# Patient Record
Sex: Male | Born: 1980 | Race: White | Hispanic: No | Marital: Married | Smoking: Former smoker
Health system: Southern US, Community
[De-identification: ages and names within clinical notes are randomized; demographics above are authoritative.]

## PROBLEM LIST (undated history)

## (undated) HISTORY — PX: ANKLE SURGERY: SHX546

---

## 2005-07-06 ENCOUNTER — Emergency Department (HOSPITAL_COMMUNITY): Admission: EM | Admit: 2005-07-06 | Discharge: 2005-07-06 | Payer: Self-pay | Admitting: Emergency Medicine

## 2006-04-01 ENCOUNTER — Emergency Department (HOSPITAL_COMMUNITY): Admission: EM | Admit: 2006-04-01 | Discharge: 2006-04-01 | Payer: Self-pay | Admitting: Emergency Medicine

## 2006-05-25 ENCOUNTER — Emergency Department (HOSPITAL_COMMUNITY): Admission: EM | Admit: 2006-05-25 | Discharge: 2006-05-26 | Payer: Self-pay | Admitting: Emergency Medicine

## 2006-07-01 ENCOUNTER — Inpatient Hospital Stay (HOSPITAL_COMMUNITY): Admission: EM | Admit: 2006-07-01 | Discharge: 2006-07-02 | Payer: Self-pay | Admitting: Emergency Medicine

## 2006-09-18 ENCOUNTER — Emergency Department (HOSPITAL_COMMUNITY): Admission: EM | Admit: 2006-09-18 | Discharge: 2006-09-18 | Payer: Self-pay | Admitting: *Deleted

## 2007-08-01 ENCOUNTER — Emergency Department (HOSPITAL_COMMUNITY): Admission: EM | Admit: 2007-08-01 | Discharge: 2007-08-01 | Payer: Self-pay | Admitting: Emergency Medicine

## 2008-02-01 ENCOUNTER — Emergency Department (HOSPITAL_COMMUNITY): Admission: EM | Admit: 2008-02-01 | Discharge: 2008-02-01 | Payer: Self-pay | Admitting: Emergency Medicine

## 2008-07-02 ENCOUNTER — Emergency Department (HOSPITAL_COMMUNITY): Admission: EM | Admit: 2008-07-02 | Discharge: 2008-07-02 | Payer: Self-pay | Admitting: Emergency Medicine

## 2008-09-24 IMAGING — CR DG FINGER INDEX 2+V*L*
3 series · 3 of 3 positions shown · non-contrast
Comparison: none

CLINICAL DATA: Laceration left index finger yesterday, pain and swelling today. 
 LEFT INDEX FINGER ? 3 VIEW:

[x finger pa left]
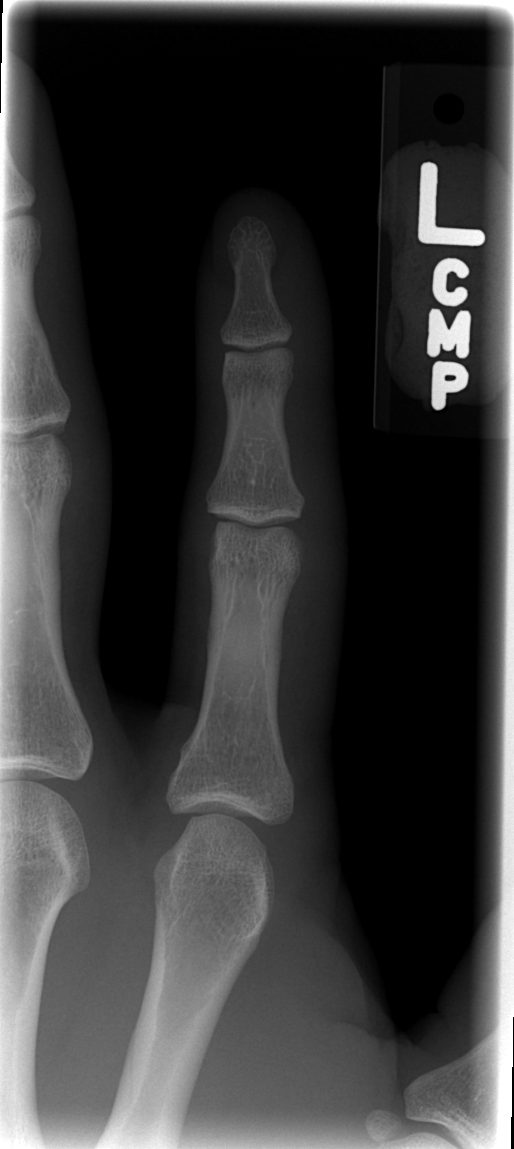

[x finger obl. left]
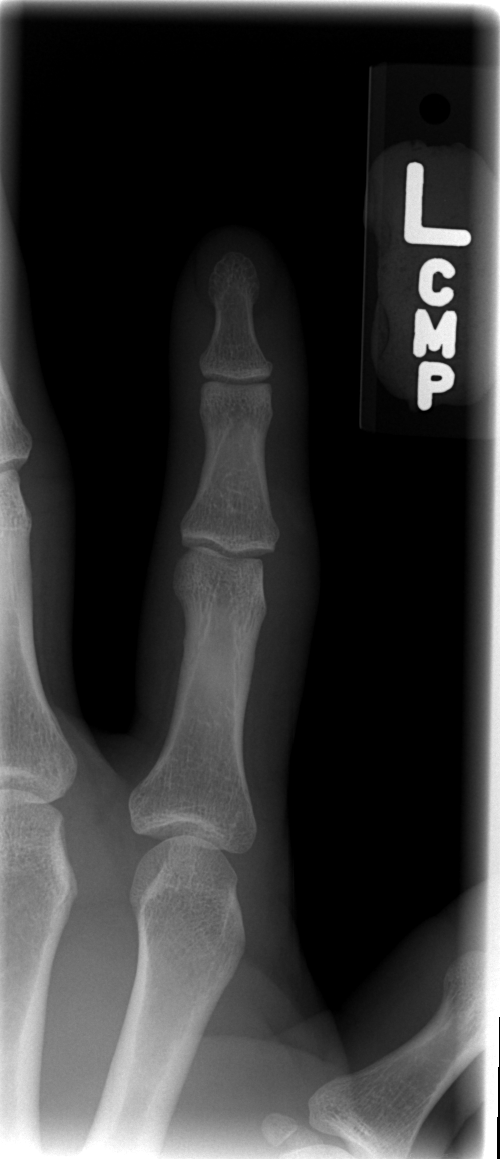

[x finger lateral left]
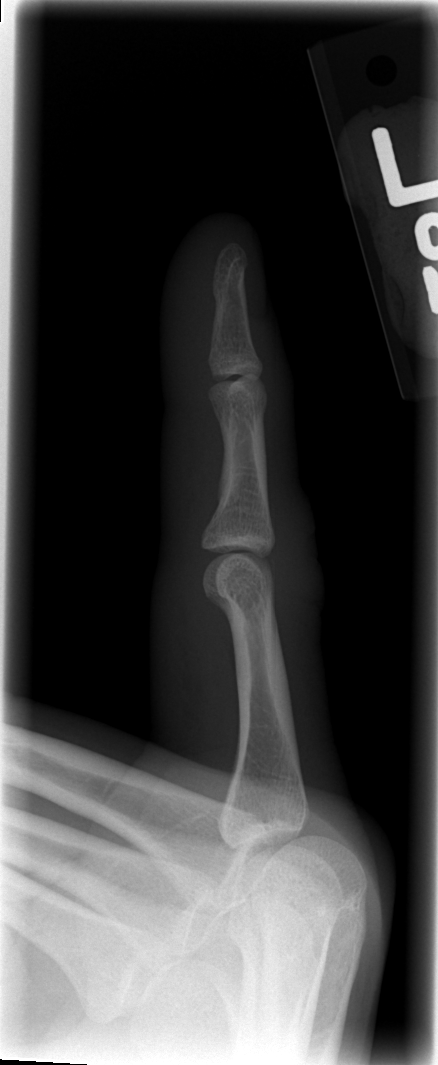

[3 of 3 positions shown; findings below may reference images not displayed]

FINDINGS: Soft tissue swelling mainly centered about the articulation.  No acute bony abnormality.
IMPRESSION: As discussed above.

## 2009-04-24 ENCOUNTER — Ambulatory Visit: Payer: Self-pay | Admitting: Diagnostic Radiology

## 2009-04-24 ENCOUNTER — Emergency Department (HOSPITAL_BASED_OUTPATIENT_CLINIC_OR_DEPARTMENT_OTHER): Admission: EM | Admit: 2009-04-24 | Discharge: 2009-04-24 | Payer: Self-pay | Admitting: Emergency Medicine

## 2009-10-24 ENCOUNTER — Emergency Department (HOSPITAL_COMMUNITY): Admission: EM | Admit: 2009-10-24 | Discharge: 2009-10-24 | Payer: Self-pay | Admitting: Emergency Medicine

## 2010-02-28 ENCOUNTER — Emergency Department (HOSPITAL_COMMUNITY): Admission: EM | Admit: 2010-02-28 | Discharge: 2010-02-28 | Payer: Self-pay | Admitting: Emergency Medicine
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
## 2013-04-02 ENCOUNTER — Emergency Department (HOSPITAL_BASED_OUTPATIENT_CLINIC_OR_DEPARTMENT_OTHER): Payer: BC Managed Care – PPO

## 2013-04-02 ENCOUNTER — Emergency Department (HOSPITAL_BASED_OUTPATIENT_CLINIC_OR_DEPARTMENT_OTHER)
Admission: EM | Admit: 2013-04-02 | Discharge: 2013-04-02 | Disposition: A | Discharge status: Home / Self Care | Payer: BC Managed Care – PPO | Attending: Emergency Medicine | Admitting: Emergency Medicine

## 2013-04-02 ENCOUNTER — Encounter (HOSPITAL_BASED_OUTPATIENT_CLINIC_OR_DEPARTMENT_OTHER): Payer: Self-pay

## 2013-04-02 DIAGNOSIS — Y929 Unspecified place or not applicable: Secondary | ICD-10-CM | POA: Insufficient documentation

## 2013-04-02 DIAGNOSIS — IMO0002 Reserved for concepts with insufficient information to code with codable children: Secondary | ICD-10-CM | POA: Insufficient documentation

## 2013-04-02 DIAGNOSIS — Y9356 Activity, jumping rope: Secondary | ICD-10-CM | POA: Insufficient documentation

## 2013-04-02 DIAGNOSIS — X58XXXA Exposure to other specified factors, initial encounter: Secondary | ICD-10-CM | POA: Insufficient documentation

## 2013-04-02 DIAGNOSIS — S62629A Displaced fracture of medial phalanx of unspecified finger, initial encounter for closed fracture: Secondary | ICD-10-CM

## 2013-04-02 NOTE — ED Provider Notes (Signed)
CSN: [id number]     Arrival date & time 04/02/13  1115 History   First MD Initiated Contact with Patient 04/02/13 1233     Chief Complaint  Patient presents with  . Finger Injury   (Consider location/radiation/quality/duration/timing/severity/associated sxs/prior Treatment) HPI Comments: Patient with pain to the left middle finger after injuring it on a rope swing yesterday. Patient denies pallor, weakness, numbness or tingling, and coolness to his left third finger.  Patient is a 32 y.o. male presenting with hand pain. The history is provided by the patient. No language interpreter was used.  Hand Pain This is a new problem. The current episode started yesterday. The problem occurs constantly. The problem has been unchanged. Associated symptoms include arthralgias, joint swelling and myalgias. Pertinent negatives include no fever, numbness or weakness. Exacerbated by: palpation and bending. He has tried NSAIDs and ice for the symptoms. The treatment provided moderate relief.    History reviewed. No pertinent past medical history. History reviewed. No pertinent past surgical history. No family history on file. History  Substance Use Topics  . Smoking status: Never Smoker   . Smokeless tobacco: Not on file  . Alcohol Use: Not on file    Review of Systems  Constitutional: Negative for fever.  Musculoskeletal: Positive for myalgias, joint swelling and arthralgias.  Neurological: Negative for weakness and numbness.    Allergies  Review of patient's allergies indicates no known allergies.  Home Medications  No current outpatient prescriptions on file. BP 125/89  Pulse 72  Temp(Src) 98.5 F (36.9 C) (Oral)  Resp 18  SpO2 97% Physical Exam  Nursing note and vitals reviewed. Constitutional: He is oriented to person, place, and time. He appears well-developed and well-nourished. No distress.  HENT:  Head: Normocephalic and atraumatic.  Eyes: Conjunctivae and EOM are normal. No  scleral icterus.  Neck: Normal range of motion.  Cardiovascular: Normal rate, regular rhythm and intact distal pulses.   Distal radial pulse 2+ and left upper extremity. Capillary refill normal.  Pulmonary/Chest: Effort normal. No respiratory distress.  Musculoskeletal: Normal range of motion.  Swelling of the left middle finger and tenderness to palpation to the third PIP and DIP joints. 4/5 strength against resistance of FDP, FDS, and extensors of the distal third finger. Finger to thumb opposition intact.  Neurological: He is alert and oriented to person, place, and time.  No sensory motor deficits appreciated.  Skin: Skin is warm and dry. No rash noted. He is not diaphoretic. No erythema. No pallor.  Psychiatric: He has a normal mood and affect. His behavior is normal.    ED Course  Procedures (including critical care time) Labs Review Labs Reviewed - No data to display  Imaging Review Dg Hand Complete Left  04/02/2013   *RADIOLOGY REPORT*  Clinical Data: History of injury to the left middle finger.  LEFT HAND - COMPLETE 3+ VIEW  Comparison: 02/01/2008.  Findings: There is a minimally displaced fracture extending obliquely through the mid and distal aspect of the third middle phalanx.  On the lateral projection, there is approximately 1 mm of volar displacement.  Whether not this extends to the articular surface at the DIP joint is uncertain.  Overlying soft tissues are swollen.  IMPRESSION: 1.  Acute minimally displaced fracture of the mid and distal aspect of the third middle phalanx, with potential intra-articular extension at the DIP joint.   Original Report Authenticated By: Trudie Reed, M.D.   MDM   1. Fracture of middle phalanx of finger, closed,  initial encounter    Uncomplicated fracture to the middle phalanx of the third finger. Patient neurovascularly intact and finger perfusing well. There is no pallor, decreased capillary refill, poikilothermia, or paresthesias  appreciated. Static finger splint applied in ED. Patient appropriate for discharge with hand specialist followup for further evaluation of injury. Rest, elevation, ibuprofen, and ice the affected area recommended for symptoms. Return precautions discussed and patient agreeable to plan with no unaddressed concerns.    Antony Madura, PA-C 04/02/13 1326

## 2013-04-02 NOTE — ED Notes (Signed)
Patient here with left hand middle finger injury x 1 day. Reports that he injured yesterday on rope swing. Swelling noted to same

## 2013-04-02 NOTE — ED Notes (Signed)
PA Humes at bedside.  

## 2013-04-03 NOTE — ED Provider Notes (Signed)
History/physical exam/procedure(s) were performed by non-physician practitioner and as supervising physician I was immediately available for consultation/collaboration. I have reviewed all notes and am in agreement with care and plan.   Nealy Hickmon S Sonnia Strong, MD 04/03/13 1246 

## 2016-04-12 ENCOUNTER — Emergency Department (HOSPITAL_BASED_OUTPATIENT_CLINIC_OR_DEPARTMENT_OTHER)
Admission: EM | Admit: 2016-04-12 | Discharge: 2016-04-12 | Disposition: A | Discharge status: Home / Self Care | Payer: Self-pay | Attending: Emergency Medicine | Admitting: Emergency Medicine

## 2016-04-12 ENCOUNTER — Encounter (HOSPITAL_BASED_OUTPATIENT_CLINIC_OR_DEPARTMENT_OTHER): Payer: Self-pay | Admitting: Emergency Medicine

## 2016-04-12 ENCOUNTER — Emergency Department (HOSPITAL_BASED_OUTPATIENT_CLINIC_OR_DEPARTMENT_OTHER): Payer: Self-pay

## 2016-04-12 DIAGNOSIS — R0602 Shortness of breath: Secondary | ICD-10-CM | POA: Insufficient documentation

## 2016-04-12 DIAGNOSIS — R079 Chest pain, unspecified: Secondary | ICD-10-CM

## 2016-04-12 DIAGNOSIS — K219 Gastro-esophageal reflux disease without esophagitis: Secondary | ICD-10-CM

## 2016-04-12 LAB — CBC
HCT: 51 % (ref 39.0–52.0)
Hemoglobin: 17.8 g/dL — ABNORMAL HIGH (ref 13.0–17.0)
MCH: 32 pg (ref 26.0–34.0)
MCHC: 34.9 g/dL (ref 30.0–36.0)
MCV: 91.7 fL (ref 78.0–100.0)
Platelets: 274 K/uL (ref 150–400)
RBC: 5.56 MIL/uL (ref 4.22–5.81)
RDW: 13.2 % (ref 11.5–15.5)
WBC: 5.6 K/uL (ref 4.0–10.5)

## 2016-04-12 LAB — BASIC METABOLIC PANEL
Anion gap: 9 (ref 5–15)
BUN: 8 mg/dL (ref 6–20)
CHLORIDE: 102 mmol/L (ref 101–111)
CO2: 28 mmol/L (ref 22–32)
Calcium: 9.7 mg/dL (ref 8.9–10.3)
Creatinine, Ser: 1.05 mg/dL (ref 0.61–1.24)
GFR calc non Af Amer: >60 mL/min (ref >60)
Glucose, Bld: 91 mg/dL (ref 65–99)
POTASSIUM: 4.3 mmol/L (ref 3.5–5.1)
SODIUM: 139 mmol/L (ref 135–145)

## 2016-04-12 LAB — TROPONIN I
Troponin I: <0.03 ng/mL
Troponin I: <0.03 ng/mL

## 2016-04-12 LAB — D-DIMER, QUANTITATIVE: D-Dimer, Quant: <0.27 ug{FEU}/mL (ref 0.00–0.50)

## 2016-04-12 MED ORDER — GI COCKTAIL ~~LOC~~
30.0000 mL | Freq: Once | ORAL | Status: AC
  Administered 2016-04-12: 30 mL via ORAL
  Filled 2016-04-12: qty 30

## 2016-04-12 MED ORDER — MORPHINE SULFATE (PF) 2 MG/ML IV SOLN
2.0000 mg | Freq: Once | INTRAVENOUS | Status: AC
Start: 2016-04-12 — End: 2016-04-12
  Administered 2016-04-12: 2 mg via INTRAVENOUS
  Filled 2016-04-12: qty 1

## 2016-04-12 MED ORDER — RANITIDINE HCL 150 MG PO TABS: 150.0000 mg | ORAL_TABLET | Freq: Two times a day (BID) | ORAL | 0 refills | Status: DC

## 2016-04-12 NOTE — ED Notes (Signed)
Chest pain started around 4 hours ago while watching TV, describes pain as pressure, lasting a few seconds and then returns. Pt denies N/V, SOB, diaphoresis.

## 2016-04-12 NOTE — Discharge Instructions (Addendum)
You were seen in emergency department for chest pain. Your work up was negative for cardiac problems. Your chest pain was likely secondary to gastroesophageal esophageal reflux disease. You will be given a prescription for Zantac. If you continue to have pain, you may start Aleve for pain. If your pain worsens, return to the ED for evaluation and follow with a Gastroenterologist.
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
## 2016-04-12 NOTE — ED Triage Notes (Signed)
Patient reports chest pain that began today.  States "i feel like my heart is skipping a beat". Denies nausea, vomiting, shortness of breath.  Reports mild dizziness which has since resolved.

## 2016-04-12 NOTE — ED Notes (Signed)
MD at bedside. 

## 2022-11-25 ENCOUNTER — Other Ambulatory Visit: Payer: Self-pay

## 2022-11-25 ENCOUNTER — Emergency Department (HOSPITAL_COMMUNITY)
Admission: EM | Admit: 2022-11-25 | Discharge: 2022-11-25 | Disposition: A | Discharge status: Home / Self Care | Payer: Self-pay | Attending: Emergency Medicine | Admitting: Emergency Medicine

## 2022-11-25 ENCOUNTER — Encounter (HOSPITAL_COMMUNITY): Payer: Self-pay

## 2022-11-25 DIAGNOSIS — T280XXA Burn of mouth and pharynx, initial encounter: Secondary | ICD-10-CM | POA: Insufficient documentation

## 2022-11-25 DIAGNOSIS — Y9289 Other specified places as the place of occurrence of the external cause: Secondary | ICD-10-CM | POA: Insufficient documentation

## 2022-11-25 DIAGNOSIS — X19XXXA Contact with other heat and hot substances, initial encounter: Secondary | ICD-10-CM | POA: Insufficient documentation

## 2022-11-25 DIAGNOSIS — Y9389 Activity, other specified: Secondary | ICD-10-CM | POA: Insufficient documentation

## 2022-11-25 NOTE — Discharge Instructions (Signed)
Use Maalox as well as ibuprofen and Tylenol for symptomatic relief.  Return to the emergency department if you have any worsening symptoms such as increased pain, increased swelling, shortness of breath or increased difficulty swallowing, fevers or other worsening symptoms.
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
## 2023-04-29 ENCOUNTER — Emergency Department (HOSPITAL_COMMUNITY)
Admission: EM | Admit: 2023-04-29 | Discharge: 2023-04-29 | Disposition: A | Discharge status: Home / Self Care | Payer: Self-pay | Attending: Emergency Medicine | Admitting: Emergency Medicine

## 2023-04-29 ENCOUNTER — Emergency Department (HOSPITAL_COMMUNITY): Payer: Self-pay

## 2023-04-29 ENCOUNTER — Other Ambulatory Visit: Payer: Self-pay

## 2023-04-29 ENCOUNTER — Encounter (HOSPITAL_COMMUNITY): Payer: Self-pay

## 2023-04-29 DIAGNOSIS — R0602 Shortness of breath: Secondary | ICD-10-CM | POA: Insufficient documentation

## 2023-04-29 DIAGNOSIS — Z87891 Personal history of nicotine dependence: Secondary | ICD-10-CM | POA: Insufficient documentation

## 2023-04-29 LAB — CBC
HCT: 56 % — ABNORMAL HIGH (ref 39.0–52.0)
Hemoglobin: 18.5 g/dL — ABNORMAL HIGH (ref 13.0–17.0)
MCH: 32.5 pg (ref 26.0–34.0)
MCHC: 33 g/dL (ref 30.0–36.0)
MCV: 98.4 fL (ref 80.0–100.0)
Platelets: 209 10*3/uL (ref 150–400)
RBC: 5.69 MIL/uL (ref 4.22–5.81)
RDW: 12.9 % (ref 11.5–15.5)
WBC: 8.8 10*3/uL (ref 4.0–10.5)
nRBC: 0 % (ref 0.0–0.2)

## 2023-04-29 LAB — BASIC METABOLIC PANEL
Anion gap: 13 (ref 5–15)
BUN: 8 mg/dL (ref 6–20)
CO2: 24 mmol/L (ref 22–32)
Calcium: 8.9 mg/dL (ref 8.9–10.3)
Chloride: 96 mmol/L — ABNORMAL LOW (ref 98–111)
Creatinine, Ser: 0.81 mg/dL (ref 0.61–1.24)
GFR, Estimated: >60 mL/min (ref >60)
Glucose, Bld: 102 mg/dL — ABNORMAL HIGH (ref 70–99)
Potassium: 4 mmol/L (ref 3.5–5.1)
Sodium: 133 mmol/L — ABNORMAL LOW (ref 135–145)

## 2023-04-29 LAB — BRAIN NATRIURETIC PEPTIDE: B Natriuretic Peptide: 34.6 pg/mL (ref 0.0–100.0)

## 2023-04-29 MED ORDER — ALBUTEROL SULFATE HFA 108 (90 BASE) MCG/ACT IN AERS: 2.0000 | INHALATION_SPRAY | Freq: Four times a day (QID) | RESPIRATORY_TRACT | 2 refills | Status: AC | PRN

## 2023-04-29 MED ORDER — IPRATROPIUM-ALBUTEROL 0.5-2.5 (3) MG/3ML IN SOLN
3.0000 mL | Freq: Once | RESPIRATORY_TRACT | Status: AC
  Administered 2023-04-29: 3 mL via RESPIRATORY_TRACT
  Filled 2023-04-29: qty 3

## 2023-04-29 NOTE — ED Provider Notes (Signed)
Patient's care assumed at 3:30 PM from [facility].  Patient is waiting for results of BNP.  Patient reports recent shortness of breath and cough. This x-ray shows no acute findings Patient is given a prescription for albuterol and discharged in stable condition.  Patient is advised to follow-up with primary care for recheck.   Elson Areas, PA-C 04/29/23 1647    Royanne Foots, DO 05/03/23 2032

## 2023-04-29 NOTE — ED Notes (Signed)
BNP added on by lab per Chanetta Marshall, states lab is "running behind"

## 2023-04-29 NOTE — Discharge Instructions (Addendum)
Return if any problems.  Schedule primary care appointment for recheck.

## 2023-04-29 NOTE — ED Provider Notes (Signed)
[place] EMERGENCY DEPARTMENT AT [facility] Provider Note   CSN: [id number] Arrival date & time: 04/29/23  1122     History  Chief Complaint  Patient presents with   Shortness of Breath    Jamie Craig is a 42 y.o. male.  The history is provided by the patient and medical records. No language interpreter was used.  Shortness of Breath    42 year old male presenting with complaints of shortness of breath.  Patient report for the past several years he has been dealing with increased shortness of breath.  Shortness of breath is more noticeable in the morning and sometimes he wakes up in the middle of the night with shortness of breath.  States that he feels like he has a lot of congestion in his chest and he has to cough and is usually productive in the morning.  For the past several days symptoms seem to be progressively worse prompting this ER visit.  Does not endorse any associated fever no chills no hemoptysis no significant abdominal pain or back pain.  He does endorse increased weight gain but attributed to regular alcohol use and poor eating habits.  No prior history of PE or DVT.  Denies any significant chest pain no fever chills.  He does use an inhaler that his mom has in the morning which did provide relief.  Denies any postnasal drip.  Home Medications Prior to Admission medications   Medication Sig Start Date End Date Taking? Authorizing Provider  ranitidine (ZANTAC) 150 MG tablet Take 1 tablet (150 mg total) by mouth 2 (two) times daily. Patient not taking: Reported on 11/25/2022 04/12/16   Bonney Aid, MD      Allergies    Patient has no known allergies.    Review of Systems   Review of Systems  Respiratory:  Positive for shortness of breath.   All other systems reviewed and are negative.   Physical Exam Updated Vital Signs BP (!) 152/105 (BP Location: Left Arm)   Pulse 94   Temp 98.7 F (37.1 C) (Oral)   Resp 20   SpO2 92%  Physical  Exam Vitals and nursing note reviewed.  Constitutional:      General: He is not in acute distress.    Appearance: He is well-developed.  HENT:     Head: Atraumatic.  Eyes:     Conjunctiva/sclera: Conjunctivae normal.  Neck:     Vascular: No JVD.  Cardiovascular:     Rate and Rhythm: Normal rate and regular rhythm.  Pulmonary:     Effort: Pulmonary effort is normal.     Breath sounds: Normal breath sounds. No decreased breath sounds, wheezing or rales.  Chest:     Chest wall: No tenderness.  Musculoskeletal:     Cervical back: Normal range of motion and neck supple.     Right lower leg: No edema.     Left lower leg: No edema.  Skin:    Findings: No rash.  Neurological:     Mental Status: He is alert and oriented to person, place, and time.     ED Results / Procedures / Treatments   Labs (all labs ordered are listed, but only abnormal results are displayed) Labs Reviewed  CBC - Abnormal; Notable for the following components:      Result Value   Hemoglobin 18.5 (*)    HCT 56.0 (*)    All other components within normal limits  BASIC METABOLIC PANEL - Abnormal; Notable  for the following components:   Sodium 133 (*)    Chloride 96 (*)    Glucose, Bld 102 (*)    All other components within normal limits  BRAIN NATRIURETIC PEPTIDE    EKG None  Radiology DG Chest 2 View  Result Date: 04/29/2023 CLINICAL DATA:  Shortness of breath. EXAM: CHEST - 2 VIEW COMPARISON:  Chest radiographs 04/12/2016 FINDINGS: Cardiac silhouette is at the upper limits of normal size. Mediastinal contours are within limits. Mild lateral left midlung subpleural curvilinear subsegmental atelectasis. No focal airspace opacity to indicate pneumonia. No pleural effusion pneumothorax. No acute skeletal abnormality. IMPRESSION: Mild lateral left midlung subpleural curvilinear subsegmental atelectasis. No acute lung process. Electronically Signed   By: Neita Garnet M.D.   On: 04/29/2023 13:59     Procedures Procedures    Medications Ordered in ED Medications - No data to display  ED Course/ Medical Decision Making/ A&P                                 Medical Decision Making Amount and/or Complexity of Data Reviewed Labs: ordered. Radiology: ordered.   BP (!) 159/102   Pulse 90   Temp 98.7 F (37.1 C) (Oral)   Resp (!) 21   SpO2 95%   55:39 PM  42 year old male presenting with complaints of shortness of breath.  Patient report for the past several years he has been dealing with increased shortness of breath.  Shortness of breath is more noticeable in the morning and sometimes he wakes up in the middle of the night with shortness of breath.  States that he feels like he has a lot of congestion in his chest and he has to cough and is usually productive in the morning.  For the past several days symptoms seem to be progressively worse prompting this ER visit.  Does not endorse any associated fever no chills no hemoptysis no significant abdominal pain or back pain.  He does endorse increased weight gain but attributed to regular alcohol use and poor eating habits.  No prior history of PE or DVT.  Denies any significant chest pain no fever chills.  He does use an inhaler that his mom has in the morning which did provide relief.  Denies any postnasal drip.  On exam, obese male appears to be in no acute discomfort.  No evidence of any JVD.  Heart with normal rate and rhythm, lungs clear to auscultation bilaterally abdomen is soft nontender no peripheral edema noted.  No wheezing.  -Labs ordered, independently viewed and interpreted by me.  Labs remarkable for labs are reasuring, BNP have not resulted -The patient was maintained on a cardiac monitor.  I personally viewed and interpreted the cardiac monitored which showed an underlying rhythm of: NSR -Imaging independently viewed and interpreted by me and I agree with radiologist's interpretation.  Result remarkable for CXR showing  atelectasis without pleural effusion or focal infiltratse -This patient presents to the ED for concern of SOB, this involves an extensive number of treatment options, and is a complaint that carries with it a high risk of complications and morbidity.  The differential diagnosis includes COPD, asthma exacerbation, CHF, PE, decondition, OSA -Co morbidities that complicate the patient evaluation includes vape -Treatment includes albuterol inhaler -Reevaluation of the patient after these medicines showed that the patient improved -PCP office notes or outside notes reviewed -Discussion with oncoming team who will f/u on BNP -  Escalation to admission/observation considered: dispo pending         Final Clinical Impression(s) / ED Diagnoses Final diagnoses:  None    Rx / DC Orders ED Discharge Orders     None         Fayrene Helper, PA-C 04/29/23 1535    Rondel Baton, MD 04/29/23 [phone]

## 2023-04-29 NOTE — ED Triage Notes (Addendum)
Pt c/o sob x past few months; last night having orthopnea, sob with exertion, coughing up more mucus than normal; states "it feels like I'm suffocating"; denies pain; endorses weight gain over past year, sob worsening with the weight gain; recently stopped smoking; denies fevers, denies known sick contacts

## 2023-06-06 ENCOUNTER — Other Ambulatory Visit: Payer: Self-pay

## 2023-06-06 ENCOUNTER — Emergency Department (HOSPITAL_COMMUNITY)
Admission: EM | Admit: 2023-06-06 | Discharge: 2023-06-06 | Disposition: A | Discharge status: Home / Self Care | Payer: Self-pay | Attending: Emergency Medicine | Admitting: Emergency Medicine

## 2023-06-06 DIAGNOSIS — X500XXA Overexertion from strenuous movement or load, initial encounter: Secondary | ICD-10-CM | POA: Insufficient documentation

## 2023-06-06 DIAGNOSIS — S39012A Strain of muscle, fascia and tendon of lower back, initial encounter: Secondary | ICD-10-CM | POA: Insufficient documentation

## 2023-06-06 MED ORDER — NAPROXEN 375 MG PO TABS: 375.0000 mg | ORAL_TABLET | Freq: Three times a day (TID) | ORAL | 0 refills | Status: AC

## 2023-06-06 MED ORDER — CYCLOBENZAPRINE HCL 10 MG PO TABS: 10.0000 mg | ORAL_TABLET | Freq: Two times a day (BID) | ORAL | 0 refills | Status: AC | PRN

## 2023-06-06 NOTE — Discharge Instructions (Addendum)
Thank you for letting me evaluate you today.  Your symptoms seem to be consistent with a lower back strain.  You do not have tenderness to your neck or spine.  I did not feel any masses or instability of your neck or spine warranting imaging at this time.  I have sent Flexeril (muscle relaxer) and naproxen (anti-inflammatory) to your [facility] pharmacy on [address]  Do not take naproxen with ibuprofen or Aleve as they are all NSAIDs.  You may take Flexeril as needed twice a day for muscle spasms however may make you drowsy so do not operate machinery until you know how it affects you.  Do not drink alcohol while on Flexeril.  You can take Flexeril at night if it makes you too drowsy and/or split the pill in half if it affects you too much.  I have also written you a work note for light duty for the next week and do not lift more than 10 pounds at work.  Please return to emergency department if you experience urinary or fecal incontinence, worsening symptoms, loss of sensation in lower extremities, unable to walk, worsening pain that is not improved with meds

## 2023-06-06 NOTE — ED Triage Notes (Signed)
Last week pt did a lot of work outside. He had some back pain but nothing significant. Last night he was sitting down and when he stool up he immediately 7/10 lower back pain. Pt has pain with movement but is able to ambulate. Does not feel like back pain he has had before

## 2023-06-06 NOTE — ED Provider Notes (Cosign Needed Addendum)
[place] EMERGENCY DEPARTMENT AT [facility] Provider Note   CSN: [id number] Arrival date & time: 06/06/23  2536     History  No chief complaint on file.   Jamie Craig is a 42 y.o. male with no known past medical history presents emergency department for evaluation of bilateral lower back pain that started last night when getting out of a chair.  He reports that he slept on the floor and had difficulty getting up secondary to low back pain.  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 Pain is worsened with bending, twisting, ambulating. He tried ibuprofen last evening without relief to pain.  He denies urinary symptoms, hematuria, incontinence, fevers, hx of kidney stones.  HPI     Home Medications Prior to Admission medications   Medication Sig Start Date End Date Taking? Authorizing Provider  cyclobenzaprine (FLEXERIL) 10 MG tablet Take 1 tablet (10 mg total) by mouth 2 (two) times daily as needed for muscle spasms. 06/06/23  Yes Judithann Sheen, PA  naproxen (NAPROSYN) 375 MG tablet Take 1 tablet (375 mg total) by mouth 3 (three) times daily with meals. 06/06/23  Yes Judithann Sheen, PA  albuterol (VENTOLIN HFA) 108 (90 Base) MCG/ACT inhaler Inhale 2 puffs into the lungs every 6 (six) hours as needed for wheezing or shortness of breath. 04/29/23   Elson Areas, PA-C  Cyanocobalamin (B-12 PO) Take 2 tablets by mouth 3 (three) times a week.    [provider]  ibuprofen (ADVIL) 200 MG tablet Take 800 mg by mouth as needed for moderate pain.    [provider]  Vitamin D-Vitamin K (VITAMIN K2-VITAMIN D3 PO) Take 1 tablet by mouth 3 (three) times a week.    [provider]      Allergies    Pineapple    Review of Systems   Review of Systems  Constitutional:  Negative for chills, fatigue and fever.  Respiratory:  Negative for cough, chest tightness, shortness of breath and wheezing.   Cardiovascular:  Negative for  chest pain and palpitations.  Gastrointestinal:  Negative for abdominal pain, constipation, diarrhea, nausea and vomiting.  Genitourinary: Negative.   Musculoskeletal:  Positive for back pain. Negative for neck pain.  Neurological:  Negative for dizziness, seizures, weakness, light-headedness, numbness and headaches.    Physical Exam Updated Vital Signs BP (!) 149/101   Pulse 74   Temp 97.9 F (36.6 C)   Resp 17   Ht 5\' 10"  (1.778 m)   Wt 127 kg   SpO2 99%   BMI 40.18 kg/m  Physical Exam Vitals and nursing note reviewed.  Constitutional:      General: He is not in acute distress.    Appearance: Normal appearance.  HENT:     Head: Normocephalic and atraumatic.  Eyes:     Conjunctiva/sclera: Conjunctivae normal.  Neck:     Comments: No crepitus, step-off, deformity, TTP to cervical neck Cardiovascular:     Rate and Rhythm: Normal rate.     Comments: Radial pulses 2+ equal bilaterally Dorsalis pedis pulses 2+ equal bilaterally Pulmonary:     Effort: Pulmonary effort is normal. No respiratory distress.     Breath sounds: Normal breath sounds.  Abdominal:     General: There is no distension.     Palpations: Abdomen is soft.     Tenderness: There is no abdominal tenderness. There is no right CVA tenderness, left CVA tenderness or guarding.  Musculoskeletal:  Cervical back: Normal range of motion and neck supple. No rigidity or tenderness.     Right lower leg: No edema.     Left lower leg: No edema.     Comments: No masses, crepitus, step-off, deformity, TTP to thoracic or lumbar spine   Skin:    Capillary Refill: Capillary refill takes less than 2 seconds.     Coloration: Skin is not jaundiced or pale.     Comments: No ecchymosis nor skin changes noted to neck or back  Neurological:     Mental Status: He is alert and oriented to person, place, and time. Mental status is at baseline.     GCS: GCS eye subscore is 4. GCS verbal subscore is 5. GCS motor subscore is 6.      Sensory: Sensation is intact. No sensory deficit.     Motor: Motor function is intact. No weakness or pronator drift.     Coordination: Heel to [facility] Test normal. Rapid alternating movements normal.     Comments: Ambulated normally without weakness however mildly difficult secondary to pain No numbness, paresthesia, nor decreased sensation to LEs bilaterally Motor function equal and intact bilaterally Mild pain to lower lumbar back bilaterally with straight leg raise     ED Results / Procedures / Treatments   Labs (all labs ordered are listed, but only abnormal results are displayed) Labs Reviewed - No data to display  EKG None  Radiology No results found.  Procedures Procedures    Medications Ordered in ED Medications - No data to display  ED Course/ Medical Decision Making/ A&P                                 Medical Decision Making Risk Prescription drug management.   Patient presents to the ED for concern of bilaterally lower back pain, this involves an extensive number of treatment options, and is a complaint that carries with it a high risk of complications and morbidity.  The differential diagnosis includes muscle strain, UTI, pyelo, infection, abscess, malignancy, cauda equina.  This is not an exhaustive list   Co morbidities that complicate the patient evaluation  None   Additional history obtained:  Additional history obtained from Nursing and Outside Medical Records   External records from outside source obtained and reviewed    Medicines ordered and prescription drug management:  I prescribed medication including naproxen and Flexeril for lower back pain  I have reviewed the patients home medicines and have made adjustments as needed   Test Considered:  I considered imaging to rule out fracture or abnormalities of the spine however patient is not tender to cervical, thoracic, lumbar mid spinous processes to indicate fracture or pathology.   Symptoms are consistent with muscle strain.  He has no paresthesia or neurological deficits from this. I considered MRI for cauda equina however patient has no urinary, fecal incontinence nor paresthesia to LEs bilaterally I considered renal study however back pain is bilateral and patient has recent strenuous physical activity to support lumbar strain.  He has no history of nephrolithiasis and has not noted blood in urine. Pain worsened with bending, twisting    Problem List / ED Course:  Lumbar back strain   Reevaluation:  After the interventions noted above, I reevaluated the patient and found that they have :stayed the same   Dispostion:  Patient is resting comfortably in bed.  Did not appear to be  in physical signs of distress.  Mildly hypertensive likely secondary to pain.  See HPI.  Physical exam is significant for no TTP, masses, crepitus, step-off, deformity to cervical, thoracic, lumbar spine.  He is not significantly tender to paraspinous musculature in thoracic or lumbar back however reports pain in musculature of lumbar back bilaterally.  He has no CVA tenderness, urinary sx to indicate Pylo or UTI.  I have low suspicion for cauda equina as patient has no neurological symptoms, incontinence.  I have low suspicion for nephrolithiasis as patient has no history, no hematuria, recent increase physical activity, and pain is bilateral worsened with movement.  Symptoms are likely due to muscle strain as patient recently was chain sawing trees last week and has physically demanding job to include lifting a "40 pound backpack".  I offered providing pain management in ED, however, patient reports that ibuprofen has not helped him and he is not interested in IM Toradol.  He stated that he was interested in picking up prescriptions following ED visit and going home to take them.  Will provide naproxen and Flexeril to use for muscle strain.  He does not have a PCP to follow-up on symptoms,  however I discussed alarm symptoms that he should return to ED for.  After consideration of the diagnostic results and the patients response to treatment, I feel that the patent would benefit from outpatient management with naproxen and Flexeril.  I discussed do not use Flexeril with alcohol or operate heavy machinery until you know how it affects you.  I discussed that it may make you drowsy and you can take at night or take half pill if it is too strong.  I discussed physical exam findings, disposition, return to respiratory precautions the patient expressed understanding agrees with plan, return precautions.  Return to emergency department precautions include but not limited to urinary symptoms, urinary or fecal incontinence, worsening pain despite medication, neurological symptoms in [place], inability to ambulate.  Patient's questions answered to his satisfaction.   Final Clinical Impression(s) / ED Diagnoses Final diagnoses:  Lumbar strain, initial encounter    Rx / DC Orders ED Discharge Orders          Ordered    naproxen (NAPROSYN) 375 MG tablet  3 times daily with meals        06/06/23 0838    cyclobenzaprine (FLEXERIL) 10 MG tablet  2 times daily PRN        06/06/23 0838             Judithann Sheen, PA 06/06/23 1610    Rondel Baton, MD 06/10/23 2306
